# Patient Record
Sex: Male | Born: 1980 | Race: Black or African American | Hispanic: No | Marital: Single | Smoking: Current every day smoker
Health system: Southern US, Community
[De-identification: ages and names within clinical notes are randomized; demographics above are authoritative.]

---

## 2003-06-27 ENCOUNTER — Other Ambulatory Visit: Payer: Self-pay

## 2008-03-06 ENCOUNTER — Emergency Department: Payer: Self-pay | Admitting: Emergency Medicine

## 2008-04-24 ENCOUNTER — Emergency Department: Payer: Self-pay | Admitting: Emergency Medicine

## 2008-09-14 ENCOUNTER — Emergency Department: Payer: Self-pay | Admitting: Emergency Medicine

## 2009-01-28 ENCOUNTER — Emergency Department: Payer: Self-pay | Admitting: Internal Medicine

## 2009-09-25 ENCOUNTER — Emergency Department: Payer: Self-pay | Admitting: Emergency Medicine

## 2011-06-28 ENCOUNTER — Emergency Department: Payer: Self-pay | Admitting: Emergency Medicine

## 2011-08-23 ENCOUNTER — Emergency Department: Payer: Self-pay | Admitting: Emergency Medicine

## 2012-10-04 ENCOUNTER — Emergency Department: Payer: Self-pay | Admitting: Emergency Medicine

## 2019-08-06 ENCOUNTER — Ambulatory Visit: Payer: Self-pay | Attending: Internal Medicine

## 2019-10-12 ENCOUNTER — Emergency Department
Admission: EM | Admit: 2019-10-12 | Discharge: 2019-10-12 | Disposition: A | Discharge status: Home / Self Care | Payer: BC Managed Care – PPO | Attending: Emergency Medicine | Admitting: Emergency Medicine

## 2019-10-12 ENCOUNTER — Other Ambulatory Visit: Payer: Self-pay

## 2019-10-12 ENCOUNTER — Emergency Department: Payer: BC Managed Care – PPO

## 2019-10-12 DIAGNOSIS — K759 Inflammatory liver disease, unspecified: Secondary | ICD-10-CM | POA: Diagnosis not present

## 2019-10-12 DIAGNOSIS — M25511 Pain in right shoulder: Secondary | ICD-10-CM | POA: Insufficient documentation

## 2019-10-12 DIAGNOSIS — G8929 Other chronic pain: Secondary | ICD-10-CM | POA: Diagnosis not present

## 2019-10-12 DIAGNOSIS — R0789 Other chest pain: Secondary | ICD-10-CM | POA: Diagnosis not present

## 2019-10-12 DIAGNOSIS — R7989 Other specified abnormal findings of blood chemistry: Secondary | ICD-10-CM

## 2019-10-12 DIAGNOSIS — R079 Chest pain, unspecified: Secondary | ICD-10-CM

## 2019-10-12 DIAGNOSIS — R1013 Epigastric pain: Secondary | ICD-10-CM

## 2019-10-12 LAB — TROPONIN I (HIGH SENSITIVITY): Troponin I (High Sensitivity): 4 ng/L (ref <18)

## 2019-10-12 LAB — COMPREHENSIVE METABOLIC PANEL
ALT: 188 U/L — ABNORMAL HIGH (ref 0–44)
AST: 516 U/L — ABNORMAL HIGH (ref 15–41)
Albumin: 4.8 g/dL (ref 3.5–5.0)
Alkaline Phosphatase: 49 U/L (ref 38–126)
Anion gap: 22 — ABNORMAL HIGH (ref 5–15)
BUN: 16 mg/dL (ref 6–20)
CO2: 16 mmol/L — ABNORMAL LOW (ref 22–32)
Calcium: 9.2 mg/dL (ref 8.9–10.3)
Chloride: 103 mmol/L (ref 98–111)
Creatinine, Ser: 1.2 mg/dL (ref 0.61–1.24)
GFR calc Af Amer: >60 mL/min (ref >60)
GFR calc non Af Amer: >60 mL/min (ref >60)
Glucose, Bld: 63 mg/dL — ABNORMAL LOW (ref 70–99)
Potassium: 4 mmol/L (ref 3.5–5.1)
Sodium: 141 mmol/L (ref 135–145)
Total Bilirubin: 2.1 mg/dL — ABNORMAL HIGH (ref 0.3–1.2)
Total Protein: 8 g/dL (ref 6.5–8.1)

## 2019-10-12 LAB — CBC WITH DIFFERENTIAL/PLATELET
Abs Immature Granulocytes: 0.04 10*3/uL (ref 0.00–0.07)
Basophils Absolute: 0 10*3/uL (ref 0.0–0.1)
Basophils Relative: 0 %
Eosinophils Absolute: 0 10*3/uL (ref 0.0–0.5)
Eosinophils Relative: 0 %
HCT: 43.9 % (ref 39.0–52.0)
Hemoglobin: 14.8 g/dL (ref 13.0–17.0)
Immature Granulocytes: 1 %
Lymphocytes Relative: 7 %
Lymphs Abs: 0.6 10*3/uL — ABNORMAL LOW (ref 0.7–4.0)
MCH: 33.3 pg (ref 26.0–34.0)
MCHC: 33.7 g/dL (ref 30.0–36.0)
MCV: 98.7 fL (ref 80.0–100.0)
Monocytes Absolute: 0.4 10*3/uL (ref 0.1–1.0)
Monocytes Relative: 5 %
Neutro Abs: 7.1 10*3/uL (ref 1.7–7.7)
Neutrophils Relative %: 87 %
Platelets: 199 10*3/uL (ref 150–400)
RBC: 4.45 MIL/uL (ref 4.22–5.81)
RDW: 11.5 % (ref 11.5–15.5)
WBC: 8.2 10*3/uL (ref 4.0–10.5)
nRBC: 0 % (ref 0.0–0.2)

## 2019-10-12 LAB — HEPATITIS PANEL, ACUTE
HCV Ab: NONREACTIVE
Hep A IgM: NONREACTIVE
Hep B C IgM: NONREACTIVE
Hepatitis B Surface Ag: NONREACTIVE

## 2019-10-12 LAB — GLUCOSE, CAPILLARY
Glucose-Capillary: 179 mg/dL — ABNORMAL HIGH (ref 70–99)
Glucose-Capillary: 48 mg/dL — ABNORMAL LOW (ref 70–99)
Glucose-Capillary: 120 mg/dL — ABNORMAL HIGH (ref 70–99)

## 2019-10-12 LAB — FIBRIN DERIVATIVES D-DIMER (ARMC ONLY): Fibrin derivatives D-dimer (ARMC): 1100.94 ng/mL (FEU) — ABNORMAL HIGH (ref 0.00–499.00)

## 2019-10-12 LAB — ACETAMINOPHEN LEVEL: Acetaminophen (Tylenol), Serum: <10 ug/mL — ABNORMAL LOW (ref 10–30)

## 2019-10-12 MED ORDER — KETOROLAC TROMETHAMINE 30 MG/ML IJ SOLN
15.0000 mg | Freq: Once | INTRAMUSCULAR | Status: AC
  Administered 2019-10-12: 15 mg via INTRAVENOUS
  Filled 2019-10-12: qty 1

## 2019-10-12 MED ORDER — IOHEXOL 350 MG/ML SOLN
75.0000 mL | Freq: Once | INTRAVENOUS | Status: AC | PRN
  Administered 2019-10-12: 75 mL via INTRAVENOUS

## 2019-10-12 MED ORDER — DEXTROSE 50 % IV SOLN: 50.0000 mL | Freq: Once | INTRAVENOUS | Status: AC

## 2019-10-12 MED ORDER — DEXTROSE 50 % IV SOLN
INTRAVENOUS | Status: AC
  Administered 2019-10-12: 50 mL via INTRAVENOUS
  Filled 2019-10-12: qty 50

## 2019-10-12 NOTE — ED Triage Notes (Signed)
Pt arrived via [facility] from home with SOB and chest pain and weakness. No med hx. EMS cbg is 57.

## 2019-10-12 NOTE — ED Provider Notes (Signed)
[facility] Emergency Department Provider Note   ____________________________________________   First MD Initiated Contact with Patient 10/12/19 [phone]     (approximate)  I have reviewed the triage vital signs and the nursing notes.   HISTORY  Chief Complaint Hypoglycemia    HPI Sean Cortez is a 39 y.o. male who woke up today short of breath with chest pain.  The chest pain is deep and achy.  Is not made worse with deep breathing.  Patient reports he did drink last night and vomited a few times.  He did not drink any more than usual.  He also smokes both cigarettes and marijuana.  EMS reports CBG was 50 7 repeat in the emergency room showed CBG of 48.  Giving him some orange juice to drink and giving him D50.  Patient also reports about once a month burning and tingling in his l right shoulder.  Is associated with neck pain and runs into the arm.         History reviewed. No pertinent past medical history.  There are no problems to display for this patient.     Prior to Admission medications   Not on File    Allergies Patient has no allergy information on record.  No family history on file.  Social History Social History   Tobacco Use  . Smoking status: Not on file  Substance Use Topics  . Alcohol use: Not on file  . Drug use: Not on file    Review of Systems  Constitutional: No fever/chills Eyes: No visual changes. ENT: No sore throat. Cardiovascular:  chest pain. Respiratory:shortness of breath. Gastrointestinal: No abdominal pain.  No nausea, no vomiting.  No diarrhea.  No constipation. Genitourinary: Negative for dysuria. Musculoskeletal: Negative for back pain. Skin: Negative for rash. Neurological: Negative for headaches, focal weakness   ____________________________________________   PHYSICAL EXAM:  VITAL SIGNS: ED Triage Vitals [10/12/19 0948]  Enc Vitals Group     BP      Pulse      Resp      Temp    Temp src      SpO2      Weight 145 lb (65.8 kg)     Height 6' (1.829 m)     Head Circumference      Peak Flow      Pain Score 0     Pain Loc      Pain Edu?      Excl. in GC?     Constitutional: Alert and oriented. Well appearing and in no acute distress. Eyes: Conjunctivae are normal. PER Head: Atraumatic. Nose: No congestion/rhinnorhea. Mouth/Throat: Mucous membranes are moist.  Oropharynx non-erythematous. Neck: No stridor. Cardiovascular: Normal rate, regular rhythm. Grossly normal heart sounds.  Good peripheral circulation. Respiratory: Normal respiratory effort.  No retractions. Lungs CTAB. Gastrointestinal: Soft and nontender. No distention. No abdominal bruits. No CVA tenderness. }Musculoskeletal: No lower extremity tenderness nor edema.  Neurologic:  Normal speech and language. No gross focal neurologic deficits are appreciated. No gait instability. Skin:  Skin is warm, dry and intact. No rash noted.  ____________________________________________   LABS (all labs ordered are listed, but only abnormal results are displayed)  Labs Reviewed  COMPREHENSIVE METABOLIC PANEL - Abnormal; Notable for the following components:      Result Value   CO2 16 (*)    Glucose, Bld 63 (*)    AST 516 (*)    ALT 188 (*)    Total  Bilirubin 2.1 (*)    Anion gap 22 (*)    All other components within normal limits  CBC WITH DIFFERENTIAL/PLATELET - Abnormal; Notable for the following components:   Lymphs Abs 0.6 (*)    All other components within normal limits  FIBRIN DERIVATIVES D-DIMER ([facility] ONLY) - Abnormal; Notable for the following components:   Fibrin derivatives D-dimer ([facility]) 1,100.94 (*)    All other components within normal limits  GLUCOSE, CAPILLARY - Abnormal; Notable for the following components:   Glucose-Capillary 48 (*)    All other components within normal limits  GLUCOSE, CAPILLARY - Abnormal; Notable for the following components:   Glucose-Capillary 179 (*)    All  other components within normal limits  ACETAMINOPHEN LEVEL - Abnormal; Notable for the following components:   Acetaminophen (Tylenol), Serum <10 (*)    All other components within normal limits  GLUCOSE, CAPILLARY - Abnormal; Notable for the following components:   Glucose-Capillary 120 (*)    All other components within normal limits  HEPATITIS PANEL, ACUTE  TROPONIN I (HIGH SENSITIVITY)  TROPONIN I (HIGH SENSITIVITY)   ____________________________________________  EKG  EKG read interpreted by me shows normal sinus rhythm rate of 84 normal axis essentially normal EKG ____________________________________________  RADIOLOGY  ED MD interpretation:    Official radiology report(s): DG Cervical Spine Complete  Result Date: 10/12/2019 CLINICAL DATA:  Neck pain for awhile, injured himself at the gym, shooting pain from neck into RIGHT arm, sometimes with numbness EXAM: CERVICAL SPINE - COMPLETE 4+ VIEW COMPARISON:  None FINDINGS: Prevertebral soft tissues normal thickness. Osseous mineralization normal. Vertebral body and disc space heights maintained. No fracture, subluxation, or bone destruction. Slight encroachment upon the RIGHT C5-C6 neural foramen by uncovertebral and facet hypertrophy. Remaining foramina patent. Apices clear. IMPRESSION: Uncovertebral and facet spurs mildly encroach upon the RIGHT C5-C6 neural foramen. Remainder of exam unremarkable. Electronically Signed   By: Lavonia Dana M.D.   On: 10/12/2019 11:48   CT Angio Chest PE W and/or Wo Contrast  Result Date: 10/12/2019 CLINICAL DATA:  39 year old male with history of shortness of breath and chest pain. Elevated D-dimer. EXAM: CT ANGIOGRAPHY CHEST WITH CONTRAST TECHNIQUE: Multidetector CT imaging of the chest was performed using the standard protocol during bolus administration of intravenous contrast. Multiplanar CT image reconstructions and MIPs were obtained to evaluate the vascular anatomy. CONTRAST:  36mL OMNIPAQUE  IOHEXOL 350 MG/ML SOLN COMPARISON:  No priors. FINDINGS: Cardiovascular: No filling defects within the pulmonary arterial tree to suggest underlying pulmonary embolism. Heart size is normal. There is no significant pericardial fluid, thickening or pericardial calcification. No atherosclerotic calcifications in the thoracic aorta or the coronary arteries. Mediastinum/Nodes: No pathologically enlarged mediastinal or hilar lymph nodes. Please note that accurate exclusion of hilar adenopathy is limited on noncontrast CT scans. Esophagus is unremarkable in appearance. No axillary lymphadenopathy. Lungs/Pleura: No suspicious appearing pulmonary nodules or masses are noted. No acute consolidative airspace disease. No pleural effusions. Upper Abdomen: Unremarkable. Musculoskeletal: There are no aggressive appearing lytic or blastic lesions noted in the visualized portions of the skeleton. Review of the MIP images confirms the above findings. IMPRESSION: 1. No evidence of pulmonary embolism. 2. No acute findings are noted in the thorax to account for the patient's symptoms. Electronically Signed   By: Vinnie Langton M.D.   On: 10/12/2019 11:54   DG Chest Portable 1 View  Result Date: 10/12/2019 CLINICAL DATA:  Chest pain, shortness breath EXAM: PORTABLE CHEST 1 VIEW COMPARISON:  None. FINDINGS: The heart  size and mediastinal contours are within normal limits. Both lungs are clear. The visualized skeletal structures are unremarkable. IMPRESSION: No active disease. Electronically Signed   By: Duanne Guess D.O.   On: 10/12/2019 10:42   US Abdomen Limited RUQ  Result Date: 10/12/2019 CLINICAL DATA:  Epigastric pain with elevated liver enzymes EXAM: ULTRASOUND ABDOMEN LIMITED RIGHT UPPER QUADRANT COMPARISON:  CT abdomen and pelvis Sep 26, 2009 FINDINGS: Gallbladder: No gallstones or wall thickening visualized. There is no pericholecystic fluid. No sonographic Murphy sign noted by sonographer. Common bile duct:  Diameter: 4 mm. No intrahepatic or extrahepatic biliary duct dilatation. Liver: No focal lesion identified. Within normal limits in parenchymal echogenicity. Portal vein is patent on color Doppler imaging with normal direction of blood flow towards the liver. Other: None. IMPRESSION: Study within normal limits. Electronically Signed   By: Bretta Bang III M.D.   On: 10/12/2019 14:22    ____________________________________________   PROCEDURES  Procedure(s) performed (including Critical Care):  Procedures   ____________________________________________   INITIAL IMPRESSION / ASSESSMENT AND PLAN / ED COURSE  Patient reported he had chest pain with shortness of breath.  EKGs and troponins were negative but his D-dimer was elevated.  CT angios showed no sign of pulmonary embolus or pneumonia or any other cause of chest pain.  He did have hypoglycemia.  He also had elevated liver functions.  The pattern of the liver function elevation was most consistent with an alcoholic hepatitis picture.  I ordered acute hepatitis panel which will be a send out.  Alcoholic hepatitis could also explain the patient's hyperglycemia.  I discussed in detail with him the fact that his liver functions are elevated most likely due to alcohol use.  He reports he is not drinking as much as he used to he is only drinking 4-5 shots of tea a day and then he gets drunk.  I told him that is too much and needs to stop.  He said he does not drink alcohol every day and he can stop without any trouble.  I encouraged him to do so and follow-up with AA if he had trouble stopping.  I also told him he needs to eat 3 meals a day even noted on have to be big meals he should have 3 meals a day.  If he is having pain he can take Motrin with food but not with alcohol.  He can take I told him 600 mg or 3 over-the-counter pills 3 times a day with food for a few days.  I told him he can follow-up with eye doctor.  He has insurance so he can  follow with [facility] or [facility] or any doctor of his choice.  He needs to do this to make sure his liver functions are improving.  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  Discussed this with Dr. Marcell Barlow neurosurgery who will follow him up in clinic.  The shoulder pain is intermittent and has been going on for many months may be getting worse.             ____________________________________________   FINAL CLINICAL IMPRESSION(S) / ED DIAGNOSES  Final diagnoses:  Chronic right shoulder pain  Hepatitis  Chest pain, unspecified type     ED Discharge Orders    None       Note:  This document was prepared using Dragon voice recognition  software and may include unintentional dictation errors.    Arnaldo Natal, MD 10/12/19 [phone]

## 2019-10-12 NOTE — Discharge Instructions (Addendum)
Your liver is inflamed.  It looks like it is from the alcohol.  Stop drinking as we discussed.  Follow-up with the primary care doctor of your choice.  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  I have asked physical therapy to set up a follow-up appointment with you.  I want you to take Motrin 3 of the 200 mg pills 3 times a day with food.  It is important to take it with food so it does not cause stomach ulcers.  Additionally it is important for you to eat 3 meals a day anyway so that your blood sugar does not drop.  I have asked Dr. Marcell Barlow the neurosurgeon to evaluate you for the arthritis in your neck to see if there is anything that they can do.  His clinic should call you with a follow-up appointment.

## 2019-10-14 DIAGNOSIS — R7989 Other specified abnormal findings of blood chemistry: Secondary | ICD-10-CM | POA: Insufficient documentation

## 2019-10-14 DIAGNOSIS — M25511 Pain in right shoulder: Secondary | ICD-10-CM | POA: Insufficient documentation

## 2019-10-20 DIAGNOSIS — M542 Cervicalgia: Secondary | ICD-10-CM | POA: Insufficient documentation

## 2019-10-21 ENCOUNTER — Other Ambulatory Visit: Payer: Self-pay | Admitting: Nurse Practitioner

## 2019-10-21 DIAGNOSIS — S12400A Unspecified displaced fracture of fifth cervical vertebra, initial encounter for closed fracture: Secondary | ICD-10-CM

## 2019-10-21 DIAGNOSIS — M542 Cervicalgia: Secondary | ICD-10-CM

## 2019-10-26 ENCOUNTER — Other Ambulatory Visit: Payer: Self-pay

## 2019-10-26 ENCOUNTER — Ambulatory Visit
Admission: RE | Admit: 2019-10-26 | Discharge: 2019-10-26 | Disposition: A | Discharge status: Home / Self Care | Payer: BC Managed Care – PPO | Source: Ambulatory Visit | Attending: Nurse Practitioner | Admitting: Nurse Practitioner

## 2019-10-26 DIAGNOSIS — M542 Cervicalgia: Secondary | ICD-10-CM | POA: Diagnosis present

## 2019-10-26 DIAGNOSIS — S12400A Unspecified displaced fracture of fifth cervical vertebra, initial encounter for closed fracture: Secondary | ICD-10-CM | POA: Diagnosis not present

## 2019-11-17 ENCOUNTER — Other Ambulatory Visit: Payer: Self-pay | Admitting: Nurse Practitioner

## 2019-11-17 DIAGNOSIS — M542 Cervicalgia: Secondary | ICD-10-CM

## 2019-11-29 ENCOUNTER — Other Ambulatory Visit: Payer: Self-pay

## 2019-11-29 ENCOUNTER — Ambulatory Visit
Admission: RE | Admit: 2019-11-29 | Discharge: 2019-11-29 | Disposition: A | Discharge status: Home / Self Care | Payer: BC Managed Care – PPO | Source: Ambulatory Visit | Attending: Nurse Practitioner | Admitting: Nurse Practitioner

## 2019-11-29 DIAGNOSIS — M542 Cervicalgia: Secondary | ICD-10-CM | POA: Diagnosis present

## 2020-05-03 ENCOUNTER — Ambulatory Visit: Payer: Self-pay

## 2020-05-09 ENCOUNTER — Ambulatory Visit: Payer: BC Managed Care – PPO

## 2020-05-14 ENCOUNTER — Encounter: Payer: Self-pay | Admitting: Family Medicine

## 2020-05-14 ENCOUNTER — Other Ambulatory Visit: Payer: Self-pay

## 2020-05-14 ENCOUNTER — Ambulatory Visit (LOCAL_COMMUNITY_HEALTH_CENTER): Payer: Self-pay

## 2020-05-14 ENCOUNTER — Ambulatory Visit: Payer: Self-pay | Admitting: Family Medicine

## 2020-05-14 DIAGNOSIS — Z719 Counseling, unspecified: Secondary | ICD-10-CM

## 2020-05-14 DIAGNOSIS — Z113 Encounter for screening for infections with a predominantly sexual mode of transmission: Secondary | ICD-10-CM

## 2020-05-14 LAB — GRAM STAIN

## 2020-05-14 MED ORDER — TETANUS-DIPHTH-ACELL PERTUSSIS 5-2.5-18.5 LF-MCG/0.5 IM SUSY: 0.5000 mL | PREFILLED_SYRINGE | Freq: Once | INTRAMUSCULAR | Status: DC

## 2020-05-14 NOTE — Progress Notes (Signed)
Pt was her for screening test today (see separate document from today) and had stated that he would like a Tdap today. Upon going through questions pt reports that he needs to reschedule for Tdap vaccine as he needs to leave so he can get back to work. Immunization appt for Tdap scheduled for 05/17/2020 per pt request and pt aware of appt date and time and to arrive early for check-in.

## 2020-05-14 NOTE — Progress Notes (Signed)
[facility] Department STI clinic/screening visit  Subjective:  Sean Cortez is a 40 y.o. male being seen today for an STI screening visit. The patient reports they do not have symptoms.    Patient has the following medical conditions:   Patient Active Problem List   Diagnosis Date Noted  . Neck pain 10/20/2019  . Elevated LFTs 10/14/2019  . Right shoulder pain 10/14/2019    Received pfizer 01/27/20  Received second dose in Oct 2021    Chief Complaint  Patient presents with  . SEXUALLY TRANSMITTED DISEASE    screening    HPI  Patient reports no sx. Likes to be checked regularly but last screening was about 3 years ago.    See flowsheet for further details and programmatic requirements.    The following portions of the patient's history were reviewed and updated as appropriate: allergies, current medications, past medical history, past social history, past surgical history and problem list.  Objective:  There were no vitals filed for this visit.  Physical Exam Constitutional:      Appearance: Normal appearance.  HENT:     Head: Normocephalic and atraumatic.     Comments: No nits or hair loss    Mouth/Throat:     Mouth: Mucous membranes are moist.     Pharynx: Oropharynx is clear. No oropharyngeal exudate or posterior oropharyngeal erythema.  Pulmonary:     Effort: Pulmonary effort is normal.  Chest:  Breasts:     Right: No axillary adenopathy or supraclavicular adenopathy.     Left: No axillary adenopathy or supraclavicular adenopathy.    Abdominal:     General: Abdomen is flat.     Palpations: Abdomen is soft. There is no hepatomegaly or mass.     Tenderness: There is no abdominal tenderness.  Genitourinary:    Pubic Area: No rash or pubic lice.      Penis: Normal.      Testes: Normal.        Right: Mass not present.        Left: Mass not present.     Epididymis:     Right: Normal.     Left: Normal.     Rectum: Normal.  Lymphadenopathy:      Head:     Right side of head: No preauricular or posterior auricular adenopathy.     Left side of head: No preauricular or posterior auricular adenopathy.     Cervical: No cervical adenopathy.     Upper Body:     Right upper body: No supraclavicular or axillary adenopathy.     Left upper body: No supraclavicular or axillary adenopathy.     Lower Body: Right inguinal adenopathy present. Left inguinal adenopathy present.  Skin:    General: Skin is warm and dry.     Findings: No rash.  Neurological:     Mental Status: He is alert and oriented to person, place, and time.       Assessment and Plan:  Sean Cortez is a 40 y.o. male presenting to the [facility] Department for STI screening  1. Screening examination for venereal disease Counseled about condom use Reviewed timing of results Discussed finding of LAD on exam to instructed to monitor sx.  Treat gram stain per standing - Has been screened for HCV prior, no new risk factors.  - Gram stain - Syphilis Serology, [facility] - HIV Long LAB  2. Need for tetanus - Desires Tdap, RN to  give.   3. Tobacco  Wants to quit but no plans to quit Discussed [place] Quitline Counseled about support systems for cessation  Return if symptoms worsen or fail to improve.  Future Appointments  Date Time Provider Department Center  05/17/2020  8:40 AM [facility] NURSE [facility] None  05/25/2020 11:15 AM Theotis Burrow, NP [facility] None    Federico Flake, MD

## 2020-05-14 NOTE — Progress Notes (Signed)
Pt is here for STI screening. 

## 2020-05-15 NOTE — Progress Notes (Addendum)
Gram stain reviewed and is negative, so no treatment needed for gram stain per standing order. Counseled pt per provider orders and pt states understanding. Pt stated he wanted to get Tdap vaccine so began going through questions for Tdap for pt and pt reports that he needs to get back to work and is unable to stay for Tdap today and will need to reschedule immunization appointment, so immunization appointment for Tdap scheduled for 05/17/2020 per pt request (please see separate document from 05/14/2020 for details). Pt aware of appt date and time and to arrive early for check in. Provider orders completed.

## 2020-05-17 ENCOUNTER — Ambulatory Visit: Payer: Self-pay

## 2020-05-19 LAB — GONOCOCCUS CULTURE

## 2020-05-22 ENCOUNTER — Encounter: Payer: Self-pay | Admitting: Student

## 2020-05-22 LAB — HM HIV SCREENING LAB: HM HIV Screening: NEGATIVE

## 2020-05-25 ENCOUNTER — Ambulatory Visit: Payer: Self-pay | Admitting: Hospice and Palliative Medicine

## 2021-08-26 IMAGING — CT CT CERVICAL SPINE W/O CM
3 of 4 series · 11 of 33 positions shown, 13 images · non-contrast
Comparison: October 12, 2019.

CLINICAL DATA: Possible C5 fracture.

EXAM:
CT CERVICAL SPINE WITHOUT CONTRAST
TECHNIQUE: Multidetector CT imaging of the cervical spine was performed without
intravenous contrast. Multiplanar CT image reconstructions were also
generated.

[Series 5: sagittal bone · sagittal · 0.20mm/px · 5 of 63 slices shown, 6 images]
[im 21/63  bone]
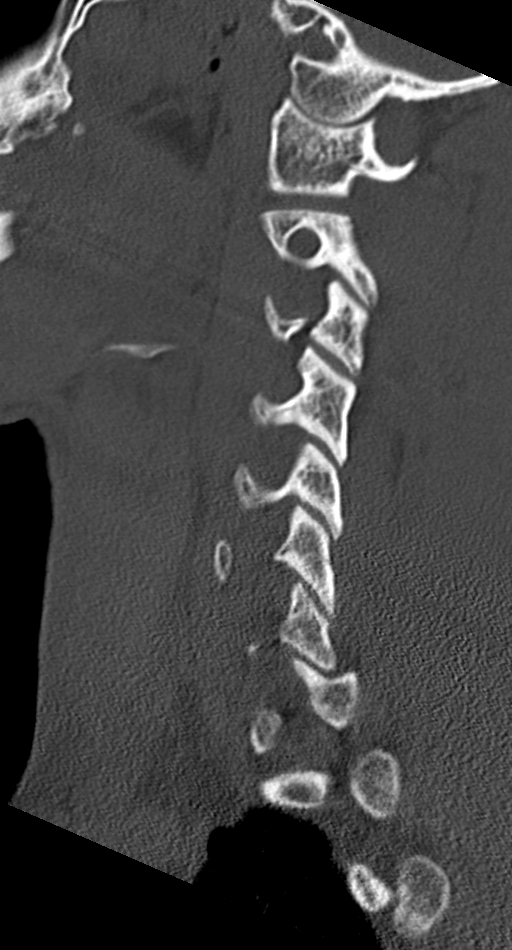
[im 26/63  bone]
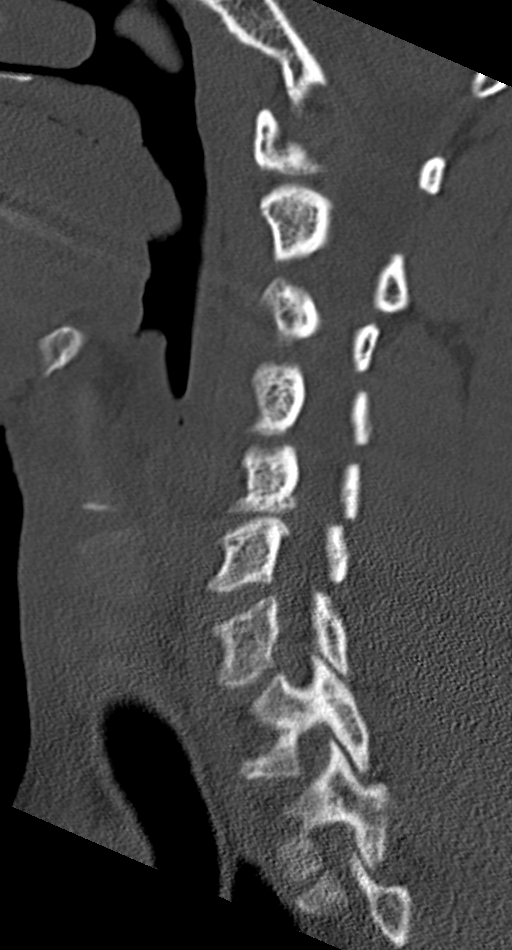
[im 32/63  soft-tissue]
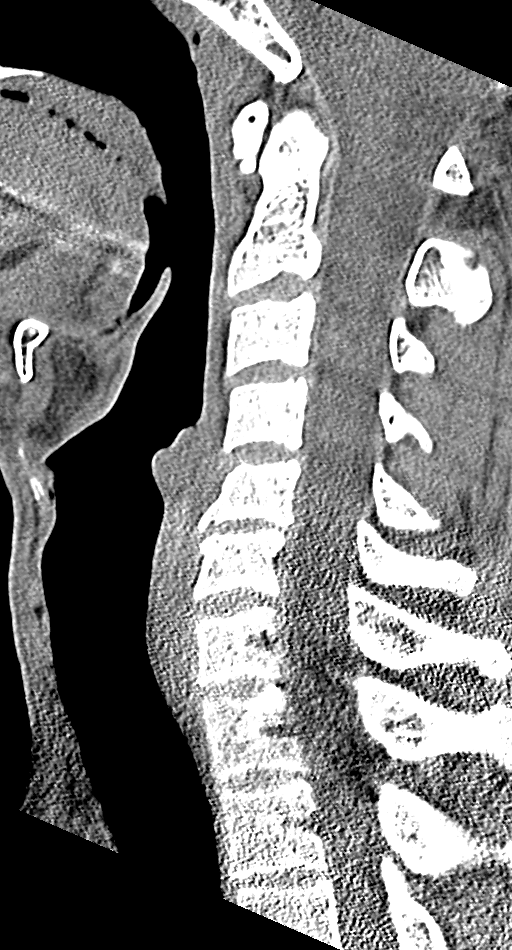
[im 32/63  bone]
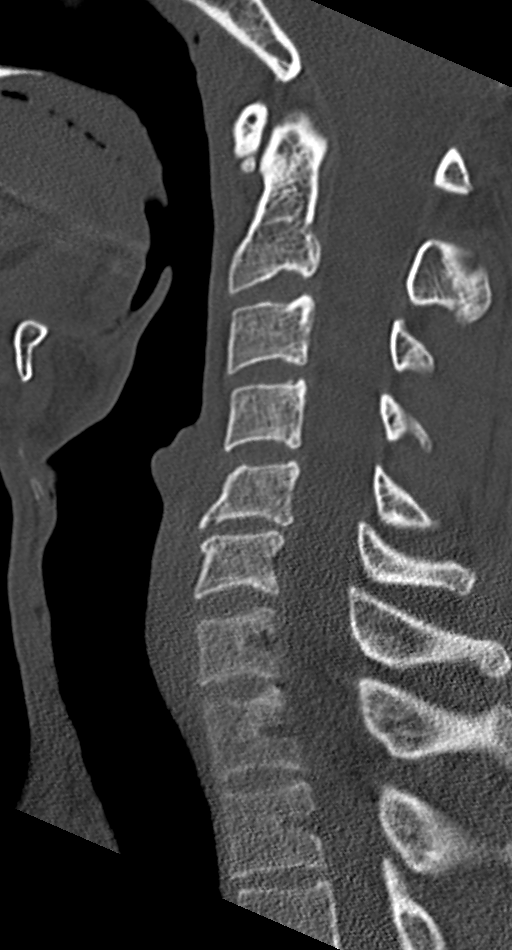
[im 37/63  bone]
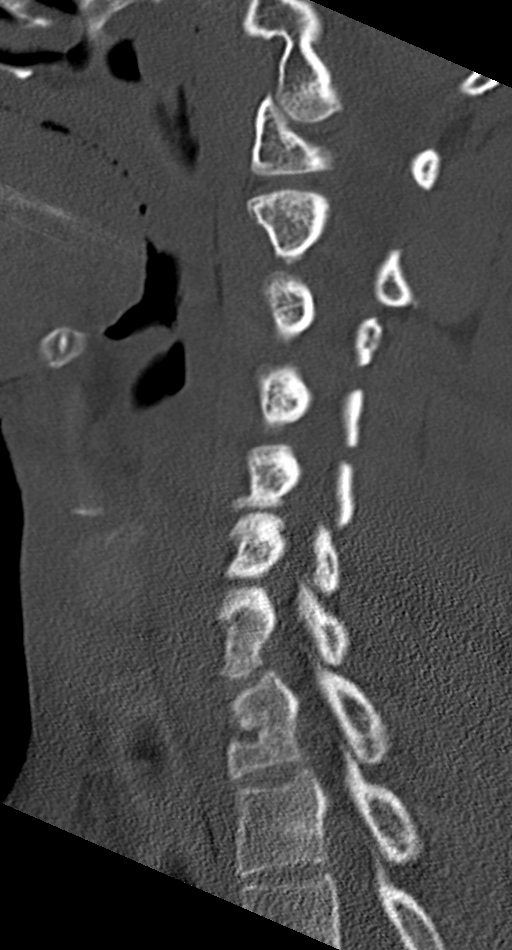
[im 42/63  bone]
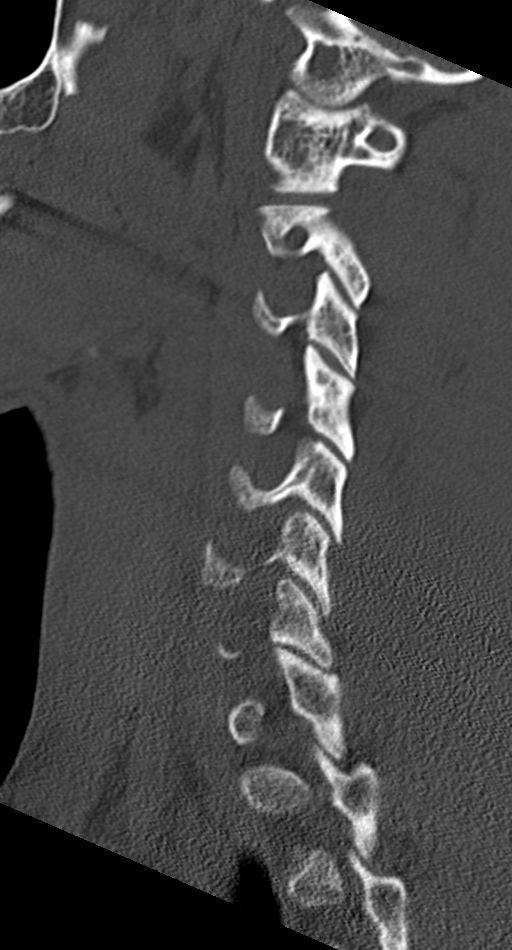

[Series 6: coronal bone · coronal · 0.25mm/px · 3 of 53 slices shown]
[im 11/53  bone]
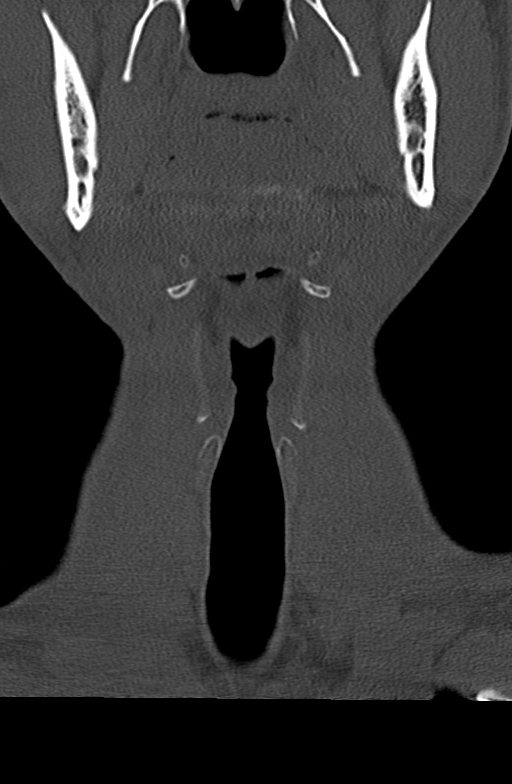
[im 21/53  bone]
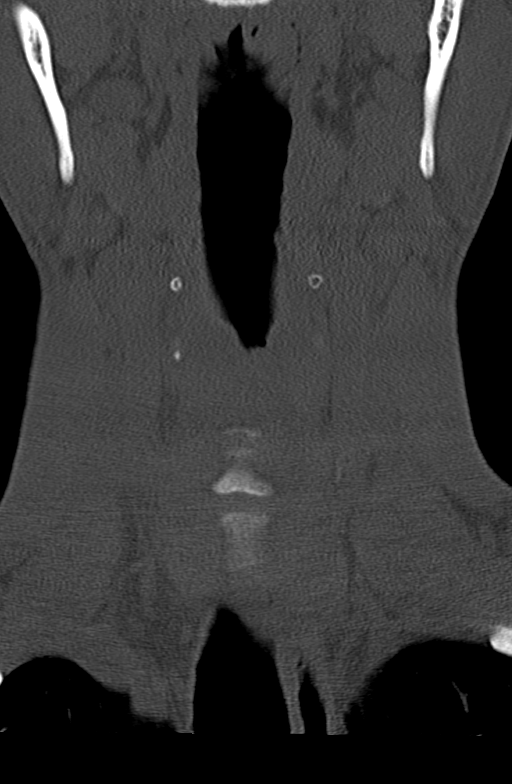
[im 32/53  bone]
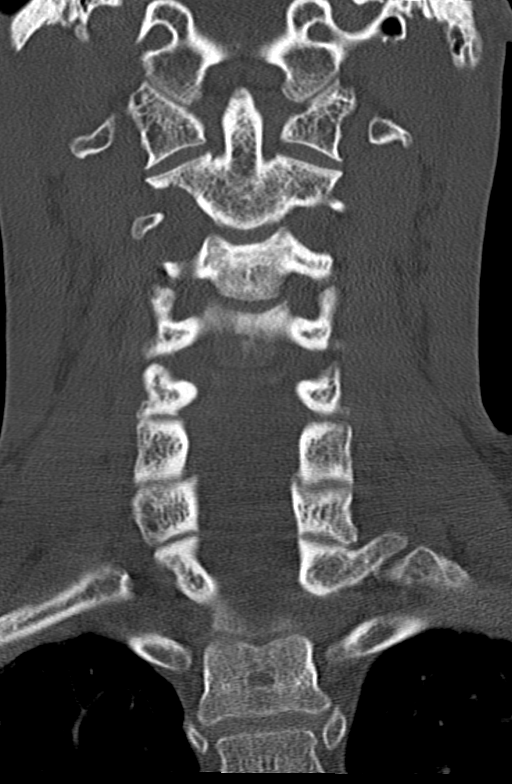

[Series 7: orthogonal bone · axial · 0.20mm/px · z∈[+172,+289]mm · 3 of 97 slices shown, 4 images]
[im 17/97  soft-tissue]
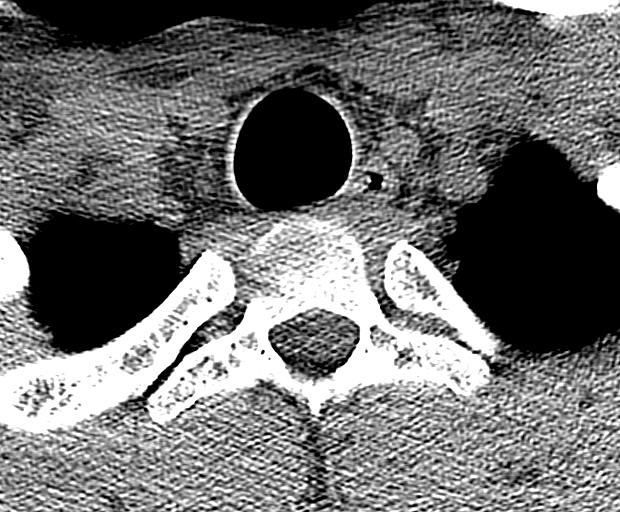
[im 17/97  bone]
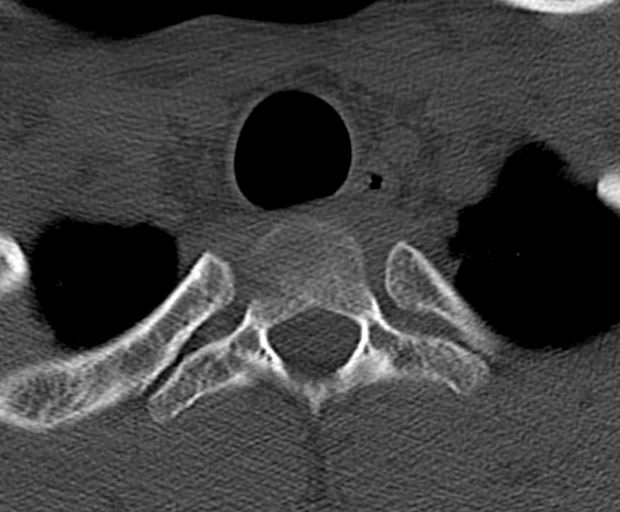
[im 49/97  bone]
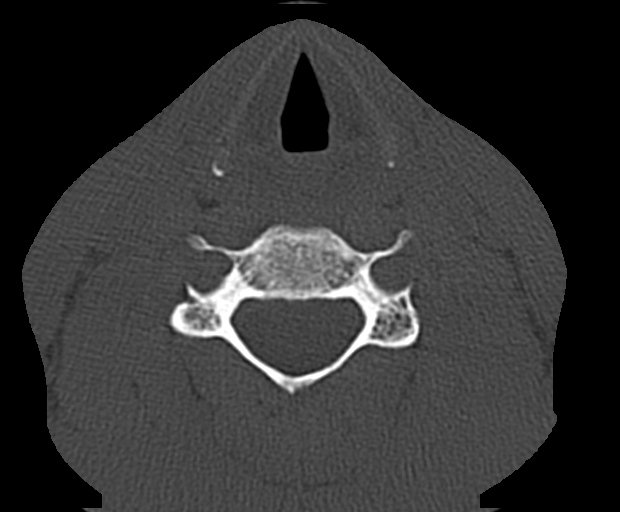
[im 81/97  bone]
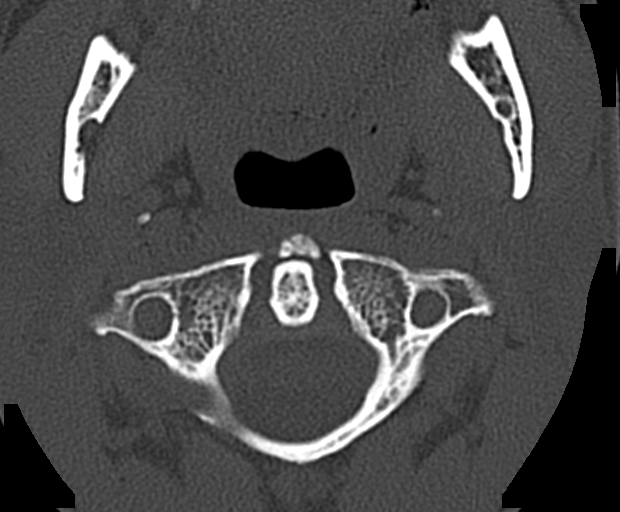

[11 of 33 positions shown; findings below may reference images not displayed]

FINDINGS: Alignment: Normal.

Skull base and vertebrae: No acute fracture. No primary bone lesion
or focal pathologic process.

Soft tissues and spinal canal: No prevertebral fluid or swelling. No
visible canal hematoma.

Disc levels: Mild degenerative disc disease is noted at C5-6 with
anterior and posterior osteophyte formation.

Upper chest: Negative.

Other: None.
IMPRESSION: Mild degenerative disc disease is noted at C5-6. No fracture or
spondylolisthesis is noted.

## 2023-09-07 ENCOUNTER — Ambulatory Visit: Payer: BC Managed Care – PPO | Admitting: Nurse Practitioner

## 2023-09-07 NOTE — Progress Notes (Deleted)
   There were no vitals taken for this visit.   Subjective:    Patient ID: Sean Cortez, male    DOB: [birth year], 43 y.o.   MRN: [id number]  HPI: Sean Cortez is a 43 y.o. male  No chief complaint on file.   Discussed the use of AI scribe software for clinical note transcription with the patient, who gave verbal consent to proceed.  History of Present Illness           No data to display          Relevant past medical, surgical, family and social history reviewed and updated as indicated. Interim medical history since our last visit reviewed. Allergies and medications reviewed and updated.  Review of Systems  Per HPI unless specifically indicated above     Objective:    There were no vitals taken for this visit.  {Vitals History (Optional):23777} Wt Readings from Last 3 Encounters:  10/12/19 145 lb (65.8 kg)    Physical Exam Physical Exam    Results for orders placed or performed in visit on 05/22/20  [facility] SCREENING LAB   Collection Time: 05/22/20 12:00 AM  Result Value Ref Range   [facility] HIV Screening Negative - Validated    {Labs (Optional):23779}    Assessment & Plan:   Problem List Items Addressed This Visit   None    Assessment and Plan Assessment & Plan         Follow up plan: No follow-ups on file.
